# Patient Record
Sex: Female | Born: 1950 | Race: White | Hispanic: No | Marital: Single | Smoking: Never smoker
Health system: Southern US, Community
[De-identification: ages and names within clinical notes are randomized; demographics above are authoritative.]

## PROBLEM LIST (undated history)

## (undated) DIAGNOSIS — M21379 Foot drop, unspecified foot: Secondary | ICD-10-CM

## (undated) DIAGNOSIS — J302 Other seasonal allergic rhinitis: Secondary | ICD-10-CM

## (undated) DIAGNOSIS — K219 Gastro-esophageal reflux disease without esophagitis: Secondary | ICD-10-CM

## (undated) DIAGNOSIS — E7800 Pure hypercholesterolemia, unspecified: Secondary | ICD-10-CM

## (undated) DIAGNOSIS — Z87898 Personal history of other specified conditions: Secondary | ICD-10-CM

## (undated) DIAGNOSIS — D241 Benign neoplasm of right breast: Secondary | ICD-10-CM

## (undated) DIAGNOSIS — Z972 Presence of dental prosthetic device (complete) (partial): Secondary | ICD-10-CM

## (undated) DIAGNOSIS — R2689 Other abnormalities of gait and mobility: Secondary | ICD-10-CM

## (undated) DIAGNOSIS — N183 Chronic kidney disease, stage 3 unspecified: Secondary | ICD-10-CM

## (undated) DIAGNOSIS — H269 Unspecified cataract: Secondary | ICD-10-CM

## (undated) DIAGNOSIS — I10 Essential (primary) hypertension: Secondary | ICD-10-CM

## (undated) HISTORY — PX: ABDOMINAL HYSTERECTOMY: SHX81

## (undated) HISTORY — DX: Pure hypercholesterolemia, unspecified: E78.00

## (undated) HISTORY — DX: Chronic kidney disease, stage 3 unspecified: N18.30

## (undated) HISTORY — DX: Foot drop, unspecified foot: M21.379

## (undated) HISTORY — DX: Other abnormalities of gait and mobility: R26.89

---
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
## 1999-07-30 ENCOUNTER — Encounter: Payer: Self-pay | Admitting: Family Medicine

## 1999-07-30 ENCOUNTER — Encounter: Admission: RE | Admit: 1999-07-30 | Discharge: 1999-07-30 | Payer: Self-pay | Admitting: Family Medicine

## 2000-09-12 ENCOUNTER — Encounter: Payer: Self-pay | Admitting: Family Medicine

## 2000-09-12 ENCOUNTER — Encounter: Admission: RE | Admit: 2000-09-12 | Discharge: 2000-09-12 | Payer: Self-pay | Admitting: Family Medicine

## 2002-01-04 ENCOUNTER — Encounter: Admission: RE | Admit: 2002-01-04 | Discharge: 2002-01-04 | Payer: Self-pay | Admitting: Family Medicine

## 2002-01-04 ENCOUNTER — Encounter: Payer: Self-pay | Admitting: Family Medicine

## 2016-08-03 ENCOUNTER — Other Ambulatory Visit: Payer: Self-pay | Admitting: Physician Assistant

## 2017-08-05 ENCOUNTER — Emergency Department (HOSPITAL_COMMUNITY)
Admission: EM | Admit: 2017-08-05 | Discharge: 2017-08-06 | Disposition: A | Discharge status: Home / Self Care | Payer: BC Managed Care – PPO | Attending: Emergency Medicine | Admitting: Emergency Medicine

## 2017-08-05 ENCOUNTER — Encounter (HOSPITAL_COMMUNITY): Payer: Self-pay | Admitting: Emergency Medicine

## 2017-08-05 DIAGNOSIS — R42 Dizziness and giddiness: Secondary | ICD-10-CM | POA: Insufficient documentation

## 2017-08-05 DIAGNOSIS — R531 Weakness: Secondary | ICD-10-CM | POA: Diagnosis present

## 2017-08-05 DIAGNOSIS — R05 Cough: Secondary | ICD-10-CM | POA: Insufficient documentation

## 2017-08-05 DIAGNOSIS — R059 Cough, unspecified: Secondary | ICD-10-CM

## 2017-08-05 LAB — I-STAT CG4 LACTIC ACID, ED: LACTIC ACID, VENOUS: 2.29 mmol/L — AB (ref 0.5–1.9)

## 2017-08-05 MED ORDER — SODIUM CHLORIDE 0.9 % IV SOLN
INTRAVENOUS | Status: DC
  Administered 2017-08-05: via INTRAVENOUS

## 2017-08-05 MED ORDER — MECLIZINE HCL 25 MG PO TABS
25.0000 mg | ORAL_TABLET | Freq: Once | ORAL | Status: AC
  Administered 2017-08-05: 25 mg via ORAL
  Filled 2017-08-05: qty 1

## 2017-08-05 NOTE — ED Provider Notes (Signed)
[facility] DEPT Provider Note   CSN: [id number] Arrival date & time: 08/05/17  2300     History   Chief Complaint Chief Complaint  Patient presents with  . Weakness  . Nausea    HPI Alexandria Mcclure is a 67 y.o. female.  67 year old female who presents with acute onset of weakness with some associated chills as well as dizziness.  Denies any headache or vomiting but did have some nausea.  States that the weakness was diffuse and became worse when she had associated dizziness.  Denies focality to her weakness.  No visual changes.  Symptoms better with remaining still.  Denies any associated chest pain or shortness of breath.  No diaphoresis.  No recent history of ear pain or URI symptoms.  Does have a history of vertigo in the past.  No treatment used prior to arrival      No past medical history on file.  There are no active problems to display for this patient.     OB History    No data available       Home Medications    Prior to Admission medications   Not on File    Family History No family history on file.  Social History Social History   Tobacco Use  . Smoking status: Not on file  Substance Use Topics  . Alcohol use: Not on file  . Drug use: Not on file     Allergies   Patient has no allergy information on record.   Review of Systems Review of Systems  All other systems reviewed and are negative.    Physical Exam Updated Vital Signs There were no vitals taken for this visit.  Physical Exam  Constitutional: She is oriented to person, place, and time. She appears well-developed and well-nourished.  Non-toxic appearance. No distress.  HENT:  Head: Normocephalic and atraumatic.  Eyes: Conjunctivae, EOM and lids are normal. Pupils are equal, round, and reactive to light.  Neck: Normal range of motion. Neck supple. No tracheal deviation present. No thyroid mass present.  Cardiovascular: Normal rate, regular  rhythm and normal heart sounds. Exam reveals no gallop.  No murmur heard. Pulmonary/Chest: Effort normal and breath sounds normal. No stridor. No respiratory distress. She has no decreased breath sounds. She has no wheezes. She has no rhonchi. She has no rales.  Abdominal: Soft. Normal appearance and bowel sounds are normal. She exhibits no distension. There is no tenderness. There is no rebound and no CVA tenderness.  Musculoskeletal: Normal range of motion. She exhibits no edema or tenderness.  Neurological: She is alert and oriented to person, place, and time. She has normal strength. She displays no tremor. No cranial nerve deficit or sensory deficit. GCS eye subscore is 4. GCS verbal subscore is 5. GCS motor subscore is 6.  Skin: Skin is warm and dry. No abrasion and no rash noted.  Psychiatric: She has a normal mood and affect. Her speech is normal and behavior is normal.  Nursing note and vitals reviewed.    ED Treatments / Results  Labs (all labs ordered are listed, but only abnormal results are displayed) Labs Reviewed  CBC WITH DIFFERENTIAL/PLATELET  COMPREHENSIVE METABOLIC PANEL  TROPONIN I  I-STAT CG4 LACTIC ACID, ED    EKG  EKG Interpretation None       Radiology No results found.  Procedures Procedures (including critical care time)  Medications Ordered in ED Medications  0.9 %  sodium chloride infusion (not  administered)  meclizine (ANTIVERT) tablet 25 mg (not administered)     Initial Impression / Assessment and Plan / ED Course  I have reviewed the triage vital signs and the nursing notes.  Pertinent labs & imaging results that were available during my care of the patient were reviewed by me and considered in my medical decision making (see chart for details).     Patient given Antivert with some relief of her symptoms.  Attempted to ambulate the patient and she was ataxic.  Concern for possible intracranial hemorrhage and head CT was negative.  Was  medicated with Valium and MRI is pending at this time.  Signed out to Dr. Gilford Raid  Final Clinical Impressions(s) / ED Diagnoses   Final diagnoses:  None    ED Discharge Orders    None       Lacretia Leigh, MD 08/06/17 [phone]

## 2017-08-05 NOTE — ED Triage Notes (Signed)
Pt arrives via ems, sudden onset nausea and weakness, and felt hot and cold. Pt comes from home. No pain, no sob, no recent injury or infection. V/s on arrival 128/78, pulse 76, spo2 98, rr18.  cbg 193. Pt has 20 in left hand, 4 mg Zofran given by ems.   No allergies, hx of high blood pressure.

## 2017-08-05 NOTE — ED Notes (Signed)
Bed: PP29 Expected date:  Expected time:  Means of arrival:  Comments: 67 yo f/weakness

## 2017-08-05 NOTE — ED Notes (Signed)
Notified EDP,Allen,MD. Pt. I-stat CG4 Lactic acid results and RN,Jon made aware.

## 2017-08-06 ENCOUNTER — Emergency Department (HOSPITAL_COMMUNITY): Payer: BC Managed Care – PPO

## 2017-08-06 LAB — URINALYSIS, ROUTINE W REFLEX MICROSCOPIC
BACTERIA UA: NONE SEEN
Bilirubin Urine: NEGATIVE
GLUCOSE, UA: NEGATIVE mg/dL
KETONES UR: NEGATIVE mg/dL
Leukocytes, UA: NEGATIVE
NITRITE: NEGATIVE
PROTEIN: NEGATIVE mg/dL
Specific Gravity, Urine: 1.016 (ref 1.005–1.030)
pH: 5 (ref 5.0–8.0)

## 2017-08-06 LAB — COMPREHENSIVE METABOLIC PANEL
ALT: 16 U/L (ref 14–54)
AST: 18 U/L (ref 15–41)
Albumin: 3.8 g/dL (ref 3.5–5.0)
Alkaline Phosphatase: 66 U/L (ref 38–126)
Anion gap: 8 (ref 5–15)
BILIRUBIN TOTAL: 0.3 mg/dL (ref 0.3–1.2)
BUN: 23 mg/dL — AB (ref 6–20)
CO2: 28 mmol/L (ref 22–32)
Calcium: 9.8 mg/dL (ref 8.9–10.3)
Chloride: 104 mmol/L (ref 101–111)
Creatinine, Ser: 1.06 mg/dL — ABNORMAL HIGH (ref 0.44–1.00)
GFR calc Af Amer: >60 mL/min (ref >60)
GFR, EST NON AFRICAN AMERICAN: 53 mL/min — AB (ref >60)
GLUCOSE: 182 mg/dL — AB (ref 65–99)
POTASSIUM: 3.9 mmol/L (ref 3.5–5.1)
Sodium: 140 mmol/L (ref 135–145)
TOTAL PROTEIN: 7.1 g/dL (ref 6.5–8.1)

## 2017-08-06 LAB — CBC WITH DIFFERENTIAL/PLATELET
BASOS ABS: 0 10*3/uL (ref 0.0–0.1)
Basophils Relative: 0 %
Eosinophils Absolute: 0 10*3/uL (ref 0.0–0.7)
Eosinophils Relative: 0 %
HEMATOCRIT: 39 % (ref 36.0–46.0)
HEMOGLOBIN: 12.6 g/dL (ref 12.0–15.0)
LYMPHS PCT: 14 %
Lymphs Abs: 1.3 10*3/uL (ref 0.7–4.0)
MCH: 29.2 pg (ref 26.0–34.0)
MCHC: 32.3 g/dL (ref 30.0–36.0)
MCV: 90.3 fL (ref 78.0–100.0)
MONO ABS: 0.2 10*3/uL (ref 0.1–1.0)
Monocytes Relative: 2 %
NEUTROS ABS: 7.9 10*3/uL — AB (ref 1.7–7.7)
NEUTROS PCT: 84 %
Platelets: 301 10*3/uL (ref 150–400)
RBC: 4.32 MIL/uL (ref 3.87–5.11)
RDW: 13.2 % (ref 11.5–15.5)
WBC: 9.4 10*3/uL (ref 4.0–10.5)

## 2017-08-06 LAB — TROPONIN I: Troponin I: <0.03 ng/mL (ref <0.03)

## 2017-08-06 MED ORDER — DIAZEPAM 5 MG PO TABS: 5.0000 mg | ORAL_TABLET | Freq: Two times a day (BID) | ORAL | 0 refills | Status: DC

## 2017-08-06 MED ORDER — DIAZEPAM 5 MG PO TABS
5.0000 mg | ORAL_TABLET | Freq: Once | ORAL | Status: AC
  Administered 2017-08-06: 5 mg via ORAL
  Filled 2017-08-06: qty 1

## 2017-08-06 MED ORDER — MECLIZINE HCL 25 MG PO TABS: 25.0000 mg | ORAL_TABLET | Freq: Three times a day (TID) | ORAL | 0 refills | Status: DC | PRN

## 2017-08-06 MED ORDER — ONDANSETRON 4 MG PO TBDP: 4.0000 mg | ORAL_TABLET | Freq: Three times a day (TID) | ORAL | 0 refills | Status: DC | PRN

## 2017-08-06 NOTE — ED Notes (Signed)
Dr.Allen at bedside with pt.

## 2017-08-06 NOTE — ED Notes (Signed)
While pt was in the bathroom to void, w/assistance, pt c/o dizziness and needed a wheelchair to get back to her room.

## 2017-08-06 NOTE — ED Provider Notes (Signed)
Pt signed out by Dr. Zenia Resides pending MRI.  MRI ok.  Pt still feels a little dizzy, but feels ready to go home.  Pt's family will be with her today.  Pt knows to return if worse.   Isla Pence, MD 08/06/17 1021

## 2017-08-07 LAB — URINE CULTURE: Culture: <10000 — AB

## 2017-09-05 ENCOUNTER — Other Ambulatory Visit: Payer: Self-pay | Admitting: Radiology

## 2017-09-21 ENCOUNTER — Ambulatory Visit: Payer: Self-pay | Admitting: General Surgery

## 2017-09-21 DIAGNOSIS — D241 Benign neoplasm of right breast: Secondary | ICD-10-CM

## 2017-09-27 DIAGNOSIS — D241 Benign neoplasm of right breast: Secondary | ICD-10-CM

## 2017-09-27 HISTORY — DX: Benign neoplasm of right breast: D24.1

## 2017-10-13 ENCOUNTER — Other Ambulatory Visit: Payer: Self-pay

## 2017-10-13 ENCOUNTER — Encounter (HOSPITAL_BASED_OUTPATIENT_CLINIC_OR_DEPARTMENT_OTHER): Payer: Self-pay | Admitting: *Deleted

## 2017-10-13 NOTE — Pre-Procedure Instructions (Signed)
To come for BMET and to pick up Ensure pre-surgery drink 10 oz. - to drink by [id number] DOS.

## 2017-10-17 ENCOUNTER — Encounter (HOSPITAL_BASED_OUTPATIENT_CLINIC_OR_DEPARTMENT_OTHER)
Admission: RE | Admit: 2017-10-17 | Discharge: 2017-10-17 | Disposition: A | Discharge status: Home / Self Care | Payer: BC Managed Care – PPO | Source: Ambulatory Visit | Attending: General Surgery | Admitting: General Surgery

## 2017-10-17 DIAGNOSIS — Z01812 Encounter for preprocedural laboratory examination: Secondary | ICD-10-CM | POA: Diagnosis not present

## 2017-10-17 DIAGNOSIS — E669 Obesity, unspecified: Secondary | ICD-10-CM | POA: Diagnosis not present

## 2017-10-17 DIAGNOSIS — Z7982 Long term (current) use of aspirin: Secondary | ICD-10-CM | POA: Diagnosis not present

## 2017-10-17 DIAGNOSIS — I10 Essential (primary) hypertension: Secondary | ICD-10-CM | POA: Diagnosis not present

## 2017-10-17 DIAGNOSIS — K219 Gastro-esophageal reflux disease without esophagitis: Secondary | ICD-10-CM | POA: Diagnosis not present

## 2017-10-17 DIAGNOSIS — Z79899 Other long term (current) drug therapy: Secondary | ICD-10-CM | POA: Diagnosis not present

## 2017-10-17 DIAGNOSIS — Z6831 Body mass index (BMI) 31.0-31.9, adult: Secondary | ICD-10-CM | POA: Diagnosis not present

## 2017-10-17 DIAGNOSIS — D241 Benign neoplasm of right breast: Secondary | ICD-10-CM | POA: Diagnosis present

## 2017-10-17 LAB — BASIC METABOLIC PANEL
Anion gap: 12 (ref 5–15)
BUN: 22 mg/dL — AB (ref 6–20)
CHLORIDE: 103 mmol/L (ref 101–111)
CO2: 25 mmol/L (ref 22–32)
CREATININE: 1.21 mg/dL — AB (ref 0.44–1.00)
Calcium: 9.9 mg/dL (ref 8.9–10.3)
GFR calc non Af Amer: 45 mL/min — ABNORMAL LOW (ref >60)
GFR, EST AFRICAN AMERICAN: 52 mL/min — AB (ref >60)
GLUCOSE: 90 mg/dL (ref 65–99)
POTASSIUM: 3.8 mmol/L (ref 3.5–5.1)
Sodium: 140 mmol/L (ref 135–145)

## 2017-10-17 NOTE — Progress Notes (Signed)
Ensure pre surgery drink given with instructions to complete by 0615 dos, pt verbalized understanding. 

## 2017-10-19 ENCOUNTER — Ambulatory Visit (HOSPITAL_BASED_OUTPATIENT_CLINIC_OR_DEPARTMENT_OTHER): Payer: BC Managed Care – PPO | Admitting: Anesthesiology

## 2017-10-19 ENCOUNTER — Other Ambulatory Visit: Payer: Self-pay

## 2017-10-19 ENCOUNTER — Ambulatory Visit (HOSPITAL_BASED_OUTPATIENT_CLINIC_OR_DEPARTMENT_OTHER)
Admission: RE | Admit: 2017-10-19 | Discharge: 2017-10-19 | Disposition: A | Discharge status: Home / Self Care | Payer: BC Managed Care – PPO | Source: Ambulatory Visit | Attending: General Surgery | Admitting: General Surgery

## 2017-10-19 ENCOUNTER — Encounter (HOSPITAL_BASED_OUTPATIENT_CLINIC_OR_DEPARTMENT_OTHER)
Admission: RE | Disposition: A | Discharge status: Home / Self Care | Payer: Self-pay | Source: Ambulatory Visit | Attending: General Surgery

## 2017-10-19 ENCOUNTER — Encounter (HOSPITAL_BASED_OUTPATIENT_CLINIC_OR_DEPARTMENT_OTHER): Payer: Self-pay | Admitting: Anesthesiology

## 2017-10-19 DIAGNOSIS — Z7982 Long term (current) use of aspirin: Secondary | ICD-10-CM | POA: Insufficient documentation

## 2017-10-19 DIAGNOSIS — K219 Gastro-esophageal reflux disease without esophagitis: Secondary | ICD-10-CM | POA: Insufficient documentation

## 2017-10-19 DIAGNOSIS — D241 Benign neoplasm of right breast: Secondary | ICD-10-CM | POA: Diagnosis not present

## 2017-10-19 DIAGNOSIS — I10 Essential (primary) hypertension: Secondary | ICD-10-CM | POA: Insufficient documentation

## 2017-10-19 DIAGNOSIS — Z6831 Body mass index (BMI) 31.0-31.9, adult: Secondary | ICD-10-CM | POA: Insufficient documentation

## 2017-10-19 DIAGNOSIS — E669 Obesity, unspecified: Secondary | ICD-10-CM | POA: Insufficient documentation

## 2017-10-19 DIAGNOSIS — Z79899 Other long term (current) drug therapy: Secondary | ICD-10-CM | POA: Insufficient documentation

## 2017-10-19 HISTORY — DX: Personal history of other specified conditions: Z87.898

## 2017-10-19 HISTORY — DX: Unspecified cataract: H26.9

## 2017-10-19 HISTORY — DX: Benign neoplasm of right breast: D24.1

## 2017-10-19 HISTORY — DX: Presence of dental prosthetic device (complete) (partial): Z97.2

## 2017-10-19 HISTORY — DX: Essential (primary) hypertension: I10

## 2017-10-19 HISTORY — DX: Gastro-esophageal reflux disease without esophagitis: K21.9

## 2017-10-19 HISTORY — DX: Other seasonal allergic rhinitis: J30.2

## 2017-10-19 HISTORY — PX: BREAST LUMPECTOMY WITH RADIOACTIVE SEED LOCALIZATION: SHX6424

## 2017-10-19 SURGERY — BREAST LUMPECTOMY WITH RADIOACTIVE SEED LOCALIZATION
Anesthesia: General | Site: Breast | Laterality: Right

## 2017-10-19 MED ORDER — PROPOFOL 500 MG/50ML IV EMUL
INTRAVENOUS | Status: AC
  Filled 2017-10-19: qty 50

## 2017-10-19 MED ORDER — EPHEDRINE SULFATE 50 MG/ML IJ SOLN
INTRAMUSCULAR | Status: DC | PRN
  Administered 2017-10-19 (×2): 10 mg via INTRAVENOUS

## 2017-10-19 MED ORDER — FENTANYL CITRATE (PF) 100 MCG/2ML IJ SOLN
50.0000 ug | INTRAMUSCULAR | Status: AC | PRN
  Administered 2017-10-19 (×2): 25 ug via INTRAVENOUS
  Administered 2017-10-19: 50 ug via INTRAVENOUS

## 2017-10-19 MED ORDER — ONDANSETRON HCL 4 MG/2ML IJ SOLN
INTRAMUSCULAR | Status: AC
  Filled 2017-10-19: qty 2

## 2017-10-19 MED ORDER — ACETAMINOPHEN 500 MG PO TABS
ORAL_TABLET | ORAL | Status: AC
  Filled 2017-10-19: qty 2

## 2017-10-19 MED ORDER — PROPOFOL 10 MG/ML IV BOLUS
INTRAVENOUS | Status: DC | PRN
  Administered 2017-10-19: 170 mg via INTRAVENOUS
  Administered 2017-10-19: 30 mg via INTRAVENOUS

## 2017-10-19 MED ORDER — DEXAMETHASONE SODIUM PHOSPHATE 4 MG/ML IJ SOLN
INTRAMUSCULAR | Status: DC | PRN
  Administered 2017-10-19: 10 mg via INTRAVENOUS

## 2017-10-19 MED ORDER — SCOPOLAMINE 1 MG/3DAYS TD PT72: 1.0000 | MEDICATED_PATCH | Freq: Once | TRANSDERMAL | Status: DC | PRN

## 2017-10-19 MED ORDER — LIDOCAINE HCL (CARDIAC) PF 100 MG/5ML IV SOSY
PREFILLED_SYRINGE | INTRAVENOUS | Status: DC | PRN
  Administered 2017-10-19: 50 mg via INTRAVENOUS

## 2017-10-19 MED ORDER — CELECOXIB 200 MG PO CAPS
200.0000 mg | ORAL_CAPSULE | ORAL | Status: AC
  Administered 2017-10-19: 200 mg via ORAL

## 2017-10-19 MED ORDER — CHLORHEXIDINE GLUCONATE CLOTH 2 % EX PADS: 6.0000 | MEDICATED_PAD | Freq: Once | CUTANEOUS | Status: DC

## 2017-10-19 MED ORDER — HYDROCODONE-ACETAMINOPHEN 7.5-325 MG PO TABS: 1.0000 | ORAL_TABLET | Freq: Once | ORAL | Status: DC | PRN

## 2017-10-19 MED ORDER — BUPIVACAINE-EPINEPHRINE (PF) 0.25% -1:200000 IJ SOLN
INTRAMUSCULAR | Status: AC
  Filled 2017-10-19: qty 30

## 2017-10-19 MED ORDER — BUPIVACAINE HCL (PF) 0.25 % IJ SOLN
INTRAMUSCULAR | Status: AC
  Filled 2017-10-19: qty 120

## 2017-10-19 MED ORDER — DEXAMETHASONE SODIUM PHOSPHATE 10 MG/ML IJ SOLN
INTRAMUSCULAR | Status: AC
  Filled 2017-10-19: qty 1

## 2017-10-19 MED ORDER — METOCLOPRAMIDE HCL 5 MG/ML IJ SOLN: 10.0000 mg | Freq: Once | INTRAMUSCULAR | Status: DC | PRN

## 2017-10-19 MED ORDER — CELECOXIB 200 MG PO CAPS
ORAL_CAPSULE | ORAL | Status: AC
  Filled 2017-10-19: qty 1

## 2017-10-19 MED ORDER — GABAPENTIN 300 MG PO CAPS
300.0000 mg | ORAL_CAPSULE | ORAL | Status: AC
  Administered 2017-10-19: 300 mg via ORAL

## 2017-10-19 MED ORDER — HEPARIN SOD (PORK) LOCK FLUSH 100 UNIT/ML IV SOLN
INTRAVENOUS | Status: AC
  Filled 2017-10-19: qty 5

## 2017-10-19 MED ORDER — FENTANYL CITRATE (PF) 100 MCG/2ML IJ SOLN
INTRAMUSCULAR | Status: AC
  Filled 2017-10-19: qty 2

## 2017-10-19 MED ORDER — LIDOCAINE HCL (CARDIAC) PF 100 MG/5ML IV SOSY
PREFILLED_SYRINGE | INTRAVENOUS | Status: AC
  Filled 2017-10-19: qty 5

## 2017-10-19 MED ORDER — METHYLENE BLUE 0.5 % INJ SOLN
INTRAVENOUS | Status: AC
  Filled 2017-10-19: qty 10

## 2017-10-19 MED ORDER — HYDROCODONE-ACETAMINOPHEN 5-325 MG PO TABS: 1.0000 | ORAL_TABLET | Freq: Four times a day (QID) | ORAL | 0 refills | Status: AC | PRN

## 2017-10-19 MED ORDER — SODIUM CHLORIDE 0.9 % IJ SOLN
INTRAMUSCULAR | Status: AC
  Filled 2017-10-19: qty 10

## 2017-10-19 MED ORDER — EPHEDRINE SULFATE 50 MG/ML IJ SOLN
INTRAMUSCULAR | Status: AC
  Filled 2017-10-19: qty 1

## 2017-10-19 MED ORDER — CEFAZOLIN SODIUM-DEXTROSE 2-4 GM/100ML-% IV SOLN
INTRAVENOUS | Status: AC
  Filled 2017-10-19: qty 100

## 2017-10-19 MED ORDER — CEFAZOLIN SODIUM-DEXTROSE 2-4 GM/100ML-% IV SOLN
2.0000 g | INTRAVENOUS | Status: AC
  Administered 2017-10-19: 2 g via INTRAVENOUS

## 2017-10-19 MED ORDER — MEPERIDINE HCL 25 MG/ML IJ SOLN: 6.2500 mg | INTRAMUSCULAR | Status: DC | PRN

## 2017-10-19 MED ORDER — GABAPENTIN 300 MG PO CAPS
ORAL_CAPSULE | ORAL | Status: AC
  Filled 2017-10-19: qty 1

## 2017-10-19 MED ORDER — MIDAZOLAM HCL 2 MG/2ML IJ SOLN
1.0000 mg | INTRAMUSCULAR | Status: DC | PRN
  Administered 2017-10-19: 2 mg via INTRAVENOUS

## 2017-10-19 MED ORDER — LACTATED RINGERS IV SOLN
INTRAVENOUS | Status: DC
  Administered 2017-10-19: 09:00:00 via INTRAVENOUS

## 2017-10-19 MED ORDER — BUPIVACAINE-EPINEPHRINE (PF) 0.25% -1:200000 IJ SOLN
INTRAMUSCULAR | Status: DC | PRN
  Administered 2017-10-19: 20 mL

## 2017-10-19 MED ORDER — FENTANYL CITRATE (PF) 100 MCG/2ML IJ SOLN: 25.0000 ug | INTRAMUSCULAR | Status: DC | PRN

## 2017-10-19 MED ORDER — HEPARIN (PORCINE) IN NACL 1000-0.9 UT/500ML-% IV SOLN
INTRAVENOUS | Status: AC
  Filled 2017-10-19: qty 500

## 2017-10-19 MED ORDER — ACETAMINOPHEN 500 MG PO TABS
1000.0000 mg | ORAL_TABLET | ORAL | Status: AC
  Administered 2017-10-19: 1000 mg via ORAL

## 2017-10-19 MED ORDER — MIDAZOLAM HCL 2 MG/2ML IJ SOLN
INTRAMUSCULAR | Status: AC
  Filled 2017-10-19: qty 2

## 2017-10-19 SURGICAL SUPPLY — 51 items
ADH SKN CLS APL DERMABOND .7 (GAUZE/BANDAGES/DRESSINGS) ×1
APPLIER CLIP 9.375 MED OPEN (MISCELLANEOUS)
APR CLP MED 9.3 20 MLT OPN (MISCELLANEOUS)
BLADE SURG 15 STRL LF DISP TIS (BLADE) ×1 IMPLANT
BLADE SURG 15 STRL SS (BLADE) ×3
CANISTER SUC SOCK COL 7IN (MISCELLANEOUS) ×1 IMPLANT
CANISTER SUCT 1200ML W/VALVE (MISCELLANEOUS) ×1 IMPLANT
CHLORAPREP W/TINT 26ML (MISCELLANEOUS) ×3 IMPLANT
CLIP APPLIE 9.375 MED OPEN (MISCELLANEOUS) IMPLANT
COVER BACK TABLE 60X90IN (DRAPES) ×3 IMPLANT
COVER MAYO STAND STRL (DRAPES) ×3 IMPLANT
COVER PROBE W GEL 5X96 (DRAPES) ×3 IMPLANT
DECANTER SPIKE VIAL GLASS SM (MISCELLANEOUS) IMPLANT
DERMABOND ADVANCED (GAUZE/BANDAGES/DRESSINGS) ×2
DERMABOND ADVANCED .7 DNX12 (GAUZE/BANDAGES/DRESSINGS) ×1 IMPLANT
DEVICE DUBIN W/COMP PLATE 8390 (MISCELLANEOUS) ×3 IMPLANT
DRAPE LAPAROSCOPIC ABDOMINAL (DRAPES) ×3 IMPLANT
DRAPE UTILITY XL STRL (DRAPES) ×3 IMPLANT
ELECT COATED BLADE 2.86 ST (ELECTRODE) ×3 IMPLANT
ELECT REM PT RETURN 9FT ADLT (ELECTROSURGICAL) ×3
ELECTRODE REM PT RTRN 9FT ADLT (ELECTROSURGICAL) ×1 IMPLANT
GLOVE BIO SURGEON STRL SZ 6.5 (GLOVE) ×1 IMPLANT
GLOVE BIO SURGEON STRL SZ7 (GLOVE) ×2 IMPLANT
GLOVE BIO SURGEON STRL SZ7.5 (GLOVE) ×6 IMPLANT
GLOVE BIO SURGEONS STRL SZ 6.5 (GLOVE) ×1
GLOVE BIOGEL PI IND STRL 6.5 (GLOVE) IMPLANT
GLOVE BIOGEL PI INDICATOR 6.5 (GLOVE) ×2
GLOVE EXAM NITRILE MD LF STRL (GLOVE) ×2 IMPLANT
GOWN STRL REUS W/ TWL LRG LVL3 (GOWN DISPOSABLE) ×2 IMPLANT
GOWN STRL REUS W/ TWL XL LVL3 (GOWN DISPOSABLE) IMPLANT
GOWN STRL REUS W/TWL LRG LVL3 (GOWN DISPOSABLE) ×6
GOWN STRL REUS W/TWL XL LVL3 (GOWN DISPOSABLE) ×3
ILLUMINATOR WAVEGUIDE N/F (MISCELLANEOUS) IMPLANT
KIT MARKER MARGIN INK (KITS) ×3 IMPLANT
LIGHT WAVEGUIDE WIDE FLAT (MISCELLANEOUS) IMPLANT
NDL HYPO 25X1 1.5 SAFETY (NEEDLE) IMPLANT
NEEDLE HYPO 25X1 1.5 SAFETY (NEEDLE) ×3 IMPLANT
NS IRRIG 1000ML POUR BTL (IV SOLUTION) ×2 IMPLANT
PACK BASIN DAY SURGERY FS (CUSTOM PROCEDURE TRAY) ×3 IMPLANT
PENCIL BUTTON HOLSTER BLD 10FT (ELECTRODE) ×3 IMPLANT
SLEEVE SCD COMPRESS KNEE MED (MISCELLANEOUS) ×3 IMPLANT
SPONGE LAP 18X18 RF (DISPOSABLE) ×3 IMPLANT
SUT MON AB 4-0 PC3 18 (SUTURE) ×2 IMPLANT
SUT SILK 2 0 SH (SUTURE) IMPLANT
SUT VICRYL 3-0 CR8 SH (SUTURE) ×3 IMPLANT
SYR CONTROL 10ML LL (SYRINGE) ×2 IMPLANT
TOWEL OR 17X24 6PK STRL BLUE (TOWEL DISPOSABLE) ×3 IMPLANT
TOWEL OR NON WOVEN STRL DISP B (DISPOSABLE) ×1 IMPLANT
TUBE CONNECTING 20'X1/4 (TUBING)
TUBE CONNECTING 20X1/4 (TUBING) ×1 IMPLANT
YANKAUER SUCT BULB TIP NO VENT (SUCTIONS) IMPLANT

## 2017-10-19 NOTE — Discharge Instructions (Signed)
No Tylenol until 3:15pm, no ibuprofen until 5:15pm!   Post Anesthesia Home Care Instructions  Activity: Get plenty of rest for the remainder of the day. A responsible individual must stay with you for 24 hours following the procedure.  For the next 24 hours, DO NOT: -Drive a car -Paediatric nurse -Drink alcoholic beverages -Take any medication unless instructed by your physician -Make any legal decisions or sign important papers.  Meals: Start with liquid foods such as gelatin or soup. Progress to regular foods as tolerated. Avoid greasy, spicy, heavy foods. If nausea and/or vomiting occur, drink only clear liquids until the nausea and/or vomiting subsides. Call your physician if vomiting continues.  Special Instructions/Symptoms: Your throat may feel dry or sore from the anesthesia or the breathing tube placed in your throat during surgery. If this causes discomfort, gargle with warm salt water. The discomfort should disappear within 24 hours.  If you had a scopolamine patch placed behind your ear for the management of post- operative nausea and/or vomiting:  1. The medication in the patch is effective for 72 hours, after which it should be removed.  Wrap patch in a tissue and discard in the trash. Wash hands thoroughly with soap and water. 2. You may remove the patch earlier than 72 hours if you experience unpleasant side effects which may include dry mouth, dizziness or visual disturbances. 3. Avoid touching the patch. Wash your hands with soap and water after contact with the patch.

## 2017-10-19 NOTE — Anesthesia Preprocedure Evaluation (Addendum)
Anesthesia Evaluation  Patient identified by MRN, date of birth, ID band Patient awake    Reviewed: Allergy & Precautions, NPO status , Patient's Chart, lab work & pertinent test results, reviewed documented beta blocker date and time   Airway Mallampati: II  TM Distance: >3 FB Neck ROM: Full    Dental  (+) Upper Dentures, Partial Lower   Pulmonary neg pulmonary ROS,    breath sounds clear to auscultation       Cardiovascular hypertension, Pt. on medications Normal cardiovascular exam Rhythm:Regular Rate:Normal     Neuro/Psych Seizures -,  Remote hx/o seizure- pregnancy related 1970, on no Rx negative psych ROS   GI/Hepatic Neg liver ROS, GERD  Medicated and Controlled,  Endo/Other  Right Breast papilloma Obesity  Renal/GU Renal InsufficiencyRenal disease  negative genitourinary   Musculoskeletal negative musculoskeletal ROS (+)   Abdominal (+) + obese,   Peds  Hematology negative hematology ROS (+)   Anesthesia Other Findings   Reproductive/Obstetrics                            Anesthesia Physical Anesthesia Plan  ASA: II  Anesthesia Plan: General   Post-op Pain Management:    Induction: Intravenous  PONV Risk Score and Plan:   Airway Management Planned: LMA  Additional Equipment:   Intra-op Plan:   Post-operative Plan: Extubation in OR  Informed Consent: I have reviewed the patients History and Physical, chart, labs and discussed the procedure including the risks, benefits and alternatives for the proposed anesthesia with the patient or authorized representative who has indicated his/her understanding and acceptance.   Dental advisory given  Plan Discussed with: Anesthesiologist, CRNA and Surgeon  Anesthesia Plan Comments:         Anesthesia Quick Evaluation

## 2017-10-19 NOTE — Op Note (Signed)
10/19/2017  11:02 AM  PATIENT:  Alexandria Mcclure  66 y.o. female  PRE-OPERATIVE DIAGNOSIS:  right breast papilloma  POST-OPERATIVE DIAGNOSIS:  right breast papilloma  PROCEDURE:  Procedure(s): RIGHT BREAST LUMPECTOMY WITH RADIOACTIVE SEED LOCALIZATION ERAS PATHWAY (Right)  SURGEON:  Surgeon(s) and Role:    * Jovita Kussmaul, MD - Primary  PHYSICIAN ASSISTANT:   ASSISTANTS: none   ANESTHESIA:   local and general  EBL:  3 mL   BLOOD ADMINISTERED:none  DRAINS: none   LOCAL MEDICATIONS USED:  MARCAINE     SPECIMEN:  Source of Specimen:  right breast tissue  DISPOSITION OF SPECIMEN:  PATHOLOGY  COUNTS:  YES  TOURNIQUET:  * No tourniquets in log *  DICTATION: .Dragon Dictation   After informed consent was obtained the patient was brought to the operating room and placed in the supine position on the operating table.  After adequate induction of general anesthesia the patient's right breast was prepped with ChloraPrep, allowed to dry, and draped in usual sterile manner.  An appropriate timeout was performed.  Previously an I-125 seed was placed in the outer aspect of the right breast to mark an area of an intraductal papilloma.  The neoprobe was set to I-125 in the area of radioactivity was readily identified in the outer aspect of the right breast.  The area around this was infiltrated with quarter percent Marcaine.  A curvilinear incision was made along the outer aspect of the breast near the axilla with a 15 blade knife.  The incision was carried through the skin and subcutaneous tissue sharply with electrocautery.  The dissection was then carried towards the radioactive seed under the direction of the neoprobe.  Once I approached the radioactive seed more closely I then removed a circular portion of breast tissue around the radioactive seed while checking the area of radioactivity frequently.  Once the specimen was removed it was oriented with the appropriate paint colors.  A  specimen radiograph was obtained that showed the clip and seed to be near the center of the specimen.  Specimen was then sent to pathology for further evaluation.  Hemostasis was achieved using the Bovie electrocautery.  The wound was infiltrated with more quarter percent Marcaine and irrigated with saline.  The deep layer of the wound was then closed with layers of interrupted 3-0 Vicryl stitches.  The skin was then closed with a running 4-0 Monocryl subcuticular stitch.  Dermabond dressings were applied.  The patient tolerated the procedure well.  At the end of the case all needle sponge and instrument counts were correct.  The patient was then awakened and taken to recovery in stable condition.  PLAN OF CARE: Discharge to home after PACU  PATIENT DISPOSITION:  PACU - hemodynamically stable.   Delay start of Pharmacological VTE agent (>24hrs) due to surgical blood loss or risk of bleeding: not applicable

## 2017-10-19 NOTE — Transfer of Care (Signed)
Immediate Anesthesia Transfer of Care Note  Patient: Alexandria Mcclure  Procedure(s) Performed: RIGHT BREAST LUMPECTOMY WITH RADIOACTIVE SEED LOCALIZATION ERAS PATHWAY (Right Breast)  Patient Location: PACU  Anesthesia Type:General  Level of Consciousness: awake and sedated  Airway & Oxygen Therapy: Patient Spontanous Breathing and Patient connected to face mask oxygen  Post-op Assessment: Report given to RN and Post -op Vital signs reviewed and stable  Post vital signs: Reviewed and stable  Last Vitals:  Vitals Value Taken Time  BP 88/52 10/19/2017 11:07 AM  Temp    Pulse 80 10/19/2017 11:09 AM  Resp 16 10/19/2017 11:09 AM  SpO2 99 % 10/19/2017 11:09 AM  Vitals shown include unvalidated device data.  Last Pain:  Vitals:   10/19/17 0847  TempSrc: Oral  PainSc:          Complications: No apparent anesthesia complications

## 2017-10-19 NOTE — Anesthesia Procedure Notes (Signed)
Procedure Name: LMA Insertion Performed by: Terrance Mass, CRNA Pre-anesthesia Checklist: Patient identified, Emergency Drugs available, Suction available and Patient being monitored Patient Re-evaluated:Patient Re-evaluated prior to induction Oxygen Delivery Method: Circle system utilized Preoxygenation: Pre-oxygenation with 100% oxygen Induction Type: IV induction Ventilation: Mask ventilation without difficulty LMA: LMA inserted LMA Size: 4.0 Number of attempts: 4 Placement Confirmation: positive ETCO2 Tube secured with: Tape Dental Injury: Teeth and Oropharynx as per pre-operative assessment

## 2017-10-19 NOTE — H&P (Signed)
Alexandria Mcclure  Location: [facility] Surgery Patient #: [id number] DOB: 1951/03/25 Undefined / Language: Alexandria Mcclure / Race: White Female   History of Present Illness  The patient is a 67 year old female who presents with a breast mass. We are asked to see the patient in consultation by Dr. Emmit Pomfret to evaluate her for a right breast papilloma. The patient is a 67 year old black female who recently went for a routine screening mammogram. At that time she was found to have a 1 cm area of calcification in the upper outer border of the right breast. This was biopsied and came back as an intraductal papilloma. She has no personal or family history of breast cancer. She has noted nipple discharge or pain in the breast. She does not smoke.   Past Surgical History  Breast Biopsy  Right. Hysterectomy (not due to cancer) - Partial   Diagnostic Studies History  Colonoscopy  1-5 years ago Mammogram  within last year Pap Smear  >5 years ago  Allergies No Known Drug Allergies Allergies Reconciled   Medication History Amlodipine Besy-Benazepril HCl (10-40MG  Capsule, Oral) Active. Triamterene-HCTZ (37.5-25MG  Tablet, Oral) Active. Aspirin (81MG  Tablet, Oral) Active. Omega-3 (500MG  Capsule, Oral) Active. Biotin (Oral) Specific strength unknown - Active. PriLOSEC OTC (20MG  Tablet DR, Oral) Active. Medications Reconciled  Social History  Alcohol use  Occasional alcohol use. Caffeine use  Carbonated beverages, Coffee, Tea. Tobacco use  Never smoker.  Family History  Diabetes Mellitus  Brother. Heart Disease  Brother. Heart disease in female family member before age 58  Heart disease in female family member before age 8  Hypertension  Brother, Father.  Pregnancy / Birth History Age at menarche  58 years. Gravida  2 Maternal age  27-20 Para  3  Other Problems Gastroesophageal Reflux Disease  High blood pressure     Review of Systems General  Present- Night Sweats. Not Present- Appetite Loss, Chills, Fatigue, Fever, Weight Gain and Weight Loss. Skin Present- Dryness. Not Present- Change in Wart/Mole, Hives, Jaundice, New Lesions, Non-Healing Wounds, Rash and Ulcer. HEENT Present- Wears glasses/contact lenses. Not Present- Earache, Hearing Loss, Hoarseness, Nose Bleed, Oral Ulcers, Ringing in the Ears, Seasonal Allergies, Sinus Pain, Sore Throat, Visual Disturbances and Yellow Eyes. Respiratory Not Present- Bloody sputum, Chronic Cough, Difficulty Breathing, Snoring and Wheezing. Breast Not Present- Breast Mass, Breast Pain, Nipple Discharge and Skin Changes. Cardiovascular Present- Swelling of Extremities. Not Present- Chest Pain, Difficulty Breathing Lying Down, Leg Cramps, Palpitations, Rapid Heart Rate and Shortness of Breath. Gastrointestinal Not Present- Abdominal Pain, Bloating, Bloody Stool, Change in Bowel Habits, Chronic diarrhea, Constipation, Difficulty Swallowing, Excessive gas, Gets full quickly at meals, Hemorrhoids, Indigestion, Nausea, Rectal Pain and Vomiting. Female Genitourinary Present- Frequency. Not Present- Nocturia, Painful Urination, Pelvic Pain and Urgency. Musculoskeletal Present- Joint Pain. Not Present- Back Pain, Joint Stiffness, Muscle Pain, Muscle Weakness and Swelling of Extremities. Neurological Present- Tingling. Not Present- Decreased Memory, Fainting, Headaches, Numbness, Seizures, Tremor, Trouble walking and Weakness. Psychiatric Not Present- Anxiety, Bipolar, Change in Sleep Pattern, Depression, Fearful and Frequent crying. Endocrine Present- Hot flashes. Not Present- Cold Intolerance, Excessive Hunger, Hair Changes, Heat Intolerance and New Diabetes. Hematology Not Present- Blood Thinners, Easy Bruising, Excessive bleeding, Gland problems, HIV and Persistent Infections.  Vitals  Weight: 183.6 lb Height: 66in Body Surface Area: 1.93 m Body Mass Index: 29.63 kg/m  Temp.: 97.89F  Pulse: 96  (Regular)  BP: 132/86 (Sitting, Left Arm, Standard)       Physical Exam  General Mental Status-Alert.  General Appearance-Consistent with stated age. Hydration-Well hydrated. Voice-Normal.  Head and Neck Head-normocephalic, atraumatic with no lesions or palpable masses. Trachea-midline. Thyroid Gland Characteristics - normal size and consistency.  Eye Eyeball - Bilateral-Extraocular movements intact. Sclera/Conjunctiva - Bilateral-No scleral icterus.  Chest and Lung Exam Chest and lung exam reveals -quiet, even and easy respiratory effort with no use of accessory muscles and on auscultation, normal breath sounds, no adventitious sounds and normal vocal resonance. Inspection Chest Wall - Normal. Back - normal.  Breast Note: There is no palpable mass in either breast. There is no palpable axillary, supraclavicular, or cervical lymphadenopathy.   Cardiovascular Cardiovascular examination reveals -normal heart sounds, regular rate and rhythm with no murmurs and normal pedal pulses bilaterally.  Abdomen Inspection Inspection of the abdomen reveals - No Hernias. Skin - Scar - no surgical scars. Palpation/Percussion Palpation and Percussion of the abdomen reveal - Soft, Non Tender, No Rebound tenderness, No Rigidity (guarding) and No hepatosplenomegaly. Auscultation Auscultation of the abdomen reveals - Bowel sounds normal.  Neurologic Neurologic evaluation reveals -alert and oriented x 3 with no impairment of recent or remote memory. Mental Status-Normal.  Musculoskeletal Normal Exam - Left-Upper Extremity Strength Normal and Lower Extremity Strength Normal. Normal Exam - Right-Upper Extremity Strength Normal and Lower Extremity Strength Normal.  Lymphatic Head & Neck  General Head & Neck Lymphatics: Bilateral - Description - Normal. Axillary  General Axillary Region: Bilateral - Description - Normal. Tenderness - Non Tender. Femoral  & Inguinal  Generalized Femoral & Inguinal Lymphatics: Bilateral - Description - Normal. Tenderness - Non Tender.    Assessment & Plan  INTRADUCTAL PAPILLOMA OF BREAST, RIGHT (D24.1) Impression: The patient appears to have an intraductal papilloma in the upper outer quadrant of the right breast. Because of its appearance and because there is about a 5% chance of upgrading to a more significant lesion she would like to have this removed. I think this is a reasonable way of dealing with this area. I have discussed with her in detail the risks and benefits of the operation as well as some of the technical aspects and she understands and wishes to proceed. I will plan for a right breast radioactive seed localized lumpectomy Current Plans Pt Education - Breast Diseases: discussed with patient and provided information.

## 2017-10-19 NOTE — Interval H&P Note (Signed)
History and Physical Interval Note:  10/19/2017 9:45 AM  Alexandria Mcclure  has presented today for surgery, with the diagnosis of right breast papilloma  The various methods of treatment have been discussed with the patient and family. After consideration of risks, benefits and other options for treatment, the patient has consented to  Procedure(s): RIGHT BREAST LUMPECTOMY WITH RADIOACTIVE SEED LOCALIZATION ERAS PATHWAY (Right) as a surgical intervention .  The patient's history has been reviewed, patient examined, no change in status, stable for surgery.  I have reviewed the patient's chart and labs.  Questions were answered to the patient's satisfaction.     TOTH III,Pradeep Beaubrun S

## 2017-10-19 NOTE — Anesthesia Postprocedure Evaluation (Signed)
Anesthesia Post Note  Patient: Alexandria Mcclure  Procedure(s) Performed: RIGHT BREAST LUMPECTOMY WITH RADIOACTIVE SEED LOCALIZATION ERAS PATHWAY (Right Breast)     Patient location during evaluation: PACU Anesthesia Type: General Level of consciousness: awake and alert and oriented Pain management: pain level controlled Vital Signs Assessment: post-procedure vital signs reviewed and stable Respiratory status: spontaneous breathing, nonlabored ventilation and respiratory function stable Cardiovascular status: blood pressure returned to baseline and stable Postop Assessment: no apparent nausea or vomiting Anesthetic complications: no    Last Vitals:  Vitals:   10/19/17 1230 10/19/17 1319  BP: 107/69 124/78  Pulse: 93 87  Resp: (!) 8 16  Temp:  (!) 36.2 C  SpO2: 100% 100%    Last Pain:  Vitals:   10/19/17 1319  TempSrc: Axillary  PainSc: 0-No pain                 Jacquan Savas A.

## 2017-10-20 ENCOUNTER — Encounter (HOSPITAL_BASED_OUTPATIENT_CLINIC_OR_DEPARTMENT_OTHER): Payer: Self-pay | Admitting: General Surgery

## 2019-09-19 IMAGING — MR MR HEAD W/O CM
10 series · 42 of 48 positions shown · non-contrast
Comparison: Head CT 08/06/2017

CLINICAL DATA: Dizziness.  Ataxia.

EXAM:
MRI HEAD WITHOUT CONTRAST
TECHNIQUE: Multiplanar, multiecho pulse sequences of the brain and surrounding
structures were obtained without intravenous contrast.

[Series 3: T1 · sagittal · 5.0mm · 0.47mm/px · 4 of 24 slices shown]
[im 1/24]
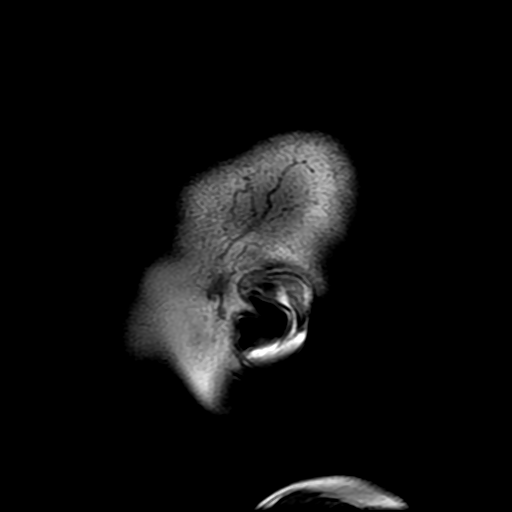
[im 8/24]
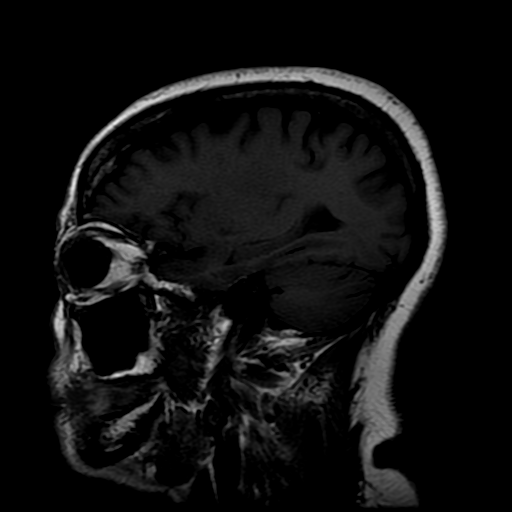
[im 16/24]
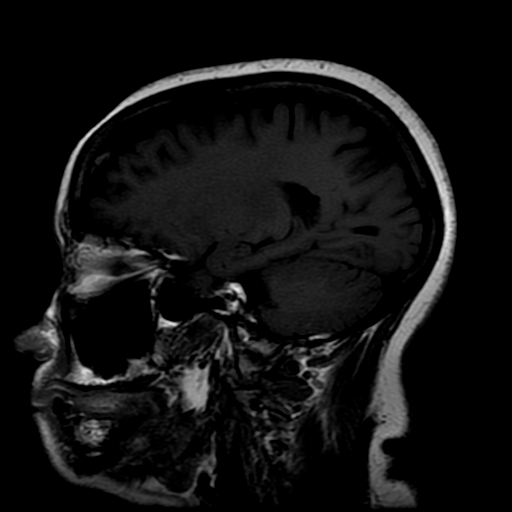
[im 24/24]
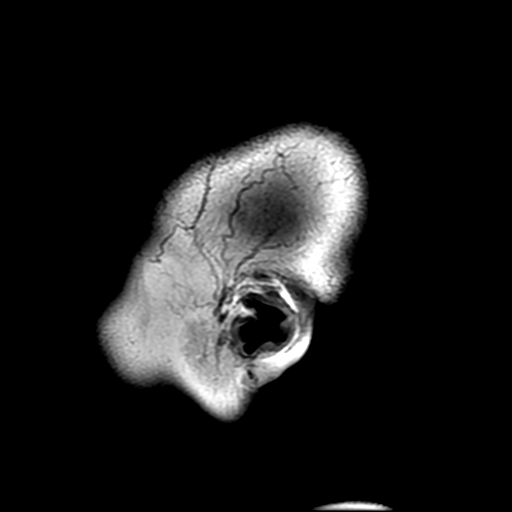

[Series 4: DWI · axial · 3.0mm · 1.09mm/px · z∈[-30,+102]mm · 9 of 90 slices shown (1 of 4)]
[im 1/90]
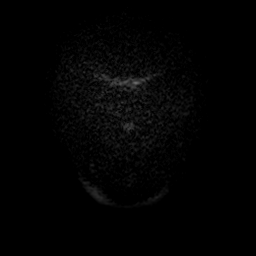
[im 17/90]
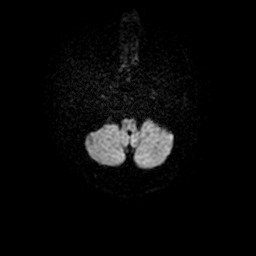
[im 25/90]
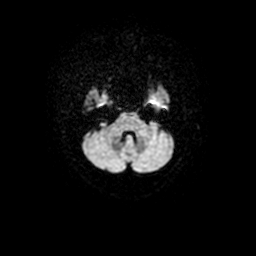
[im 41/90]
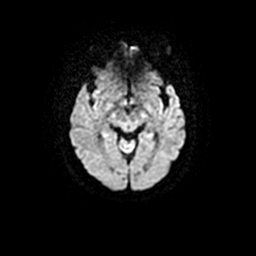
[im 49/90]
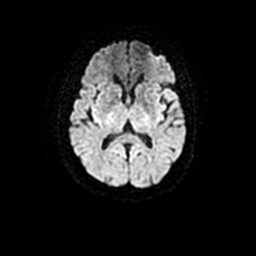
[im 65/90]
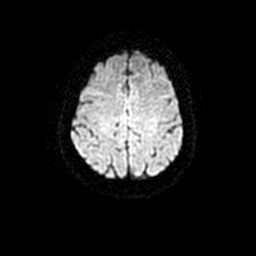
[im 73/90]
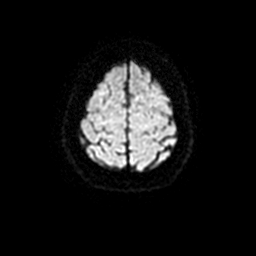
[im 81/90]
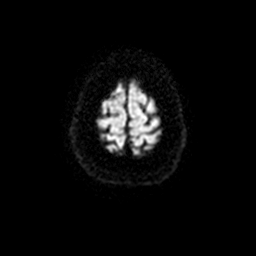
[im 90/90]
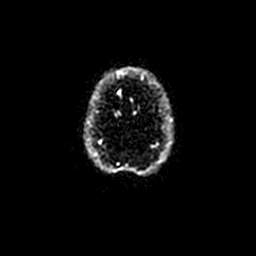

[Series 5: DWI · coronal · 5.0mm · 1.09mm/px · 9 of 70 slices shown (2 of 4)]
[im 1/70]
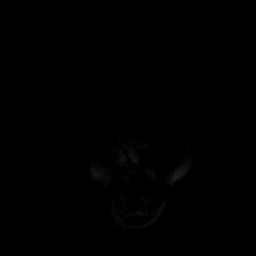
[im 9/70]
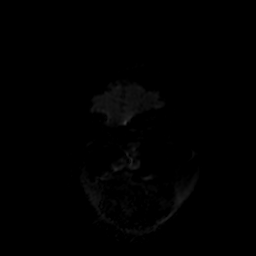
[im 18/70]
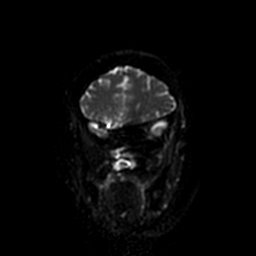
[im 26/70]
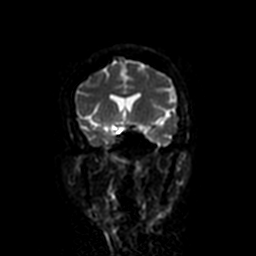
[im 35/70]
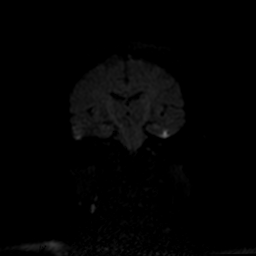
[im 44/70]
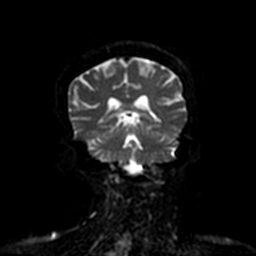
[im 52/70]
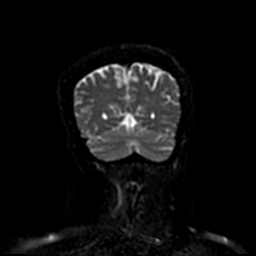
[im 61/70]
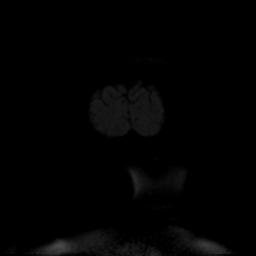
[im 70/70]
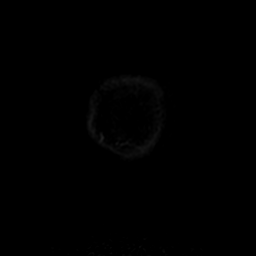

[Series 6: T2 · axial · 5.0mm · 0.86mm/px · z∈[-30,+103]mm · 2 of 20 slices shown (1 of 2)]
[im 1/20]
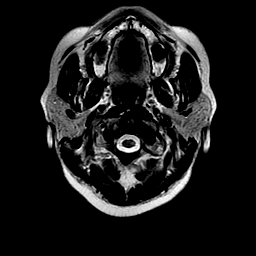
[im 20/20]
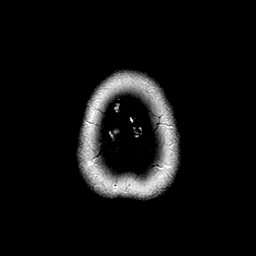

[Series 7: FLAIR · axial · 5.0mm · 0.43mm/px · z∈[-30,+103]mm · 2 of 20 slices shown]
[im 1/20]
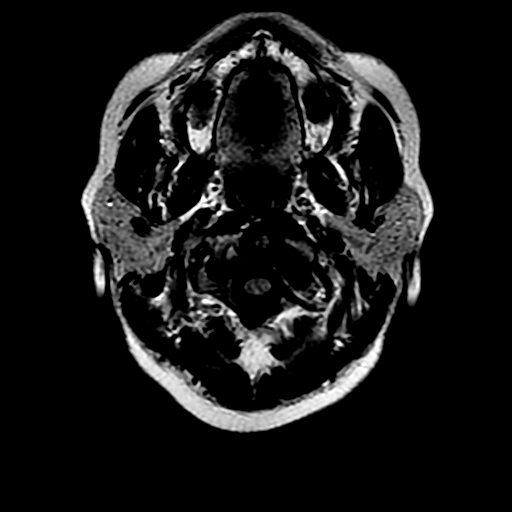
[im 20/20]
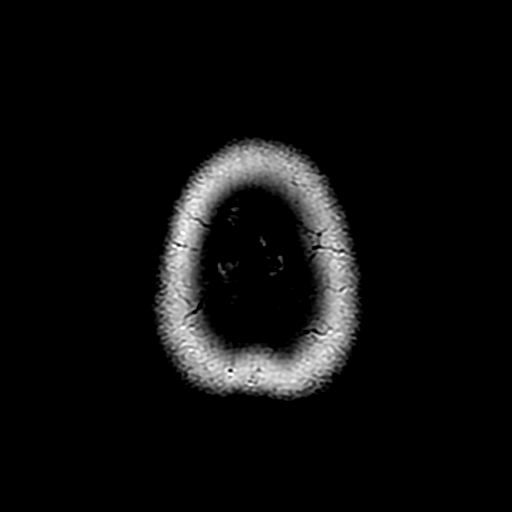

[Series 8: ax mpgr · axial · 5.0mm · 0.43mm/px · z∈[-30,+103]mm · 2 of 20 slices shown]
[im 1/20]
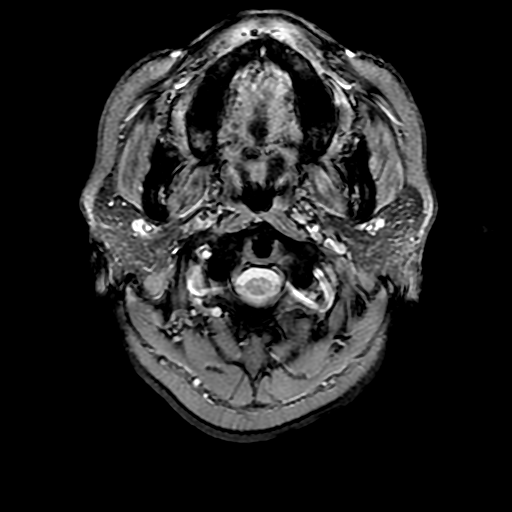
[im 20/20]
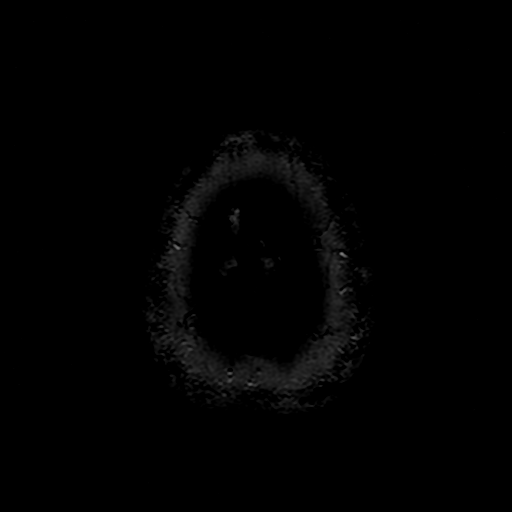

[Series 9: ax fspgr irp · axial · 3.0mm · 0.47mm/px · z∈[-29,+1]mm · 2 of 44 slices shown]
[im 1/44]
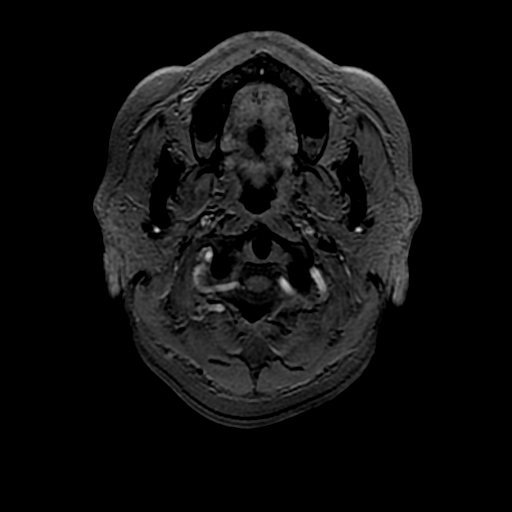
[im 11/44]
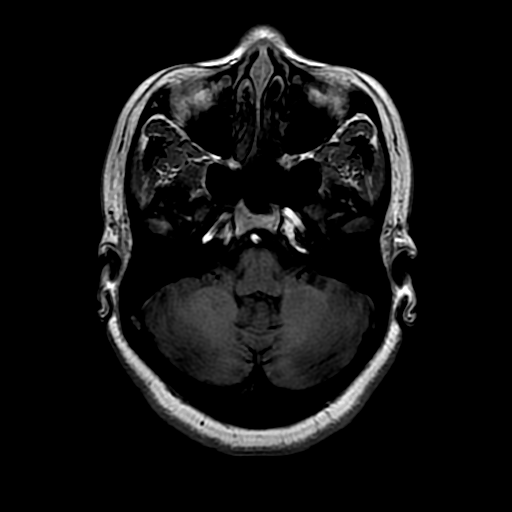

[Series 10: T2 · coronal · 5.0mm · 0.90mm/px · 3 of 27 slices shown (2 of 2)]
[im 1/27]
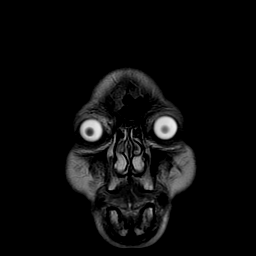
[im 14/27]
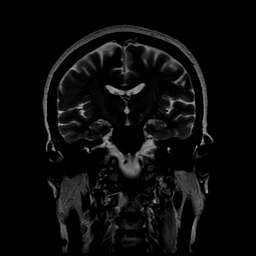
[im 27/27]
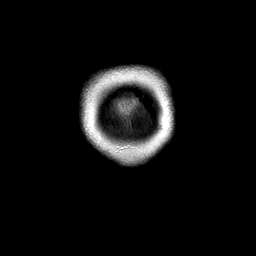

[Series 400: DWI · axial · 3.0mm · 1.09mm/px · z∈[-30,+102]mm · 5 of 45 slices shown (3 of 4)]
[im 1/45]
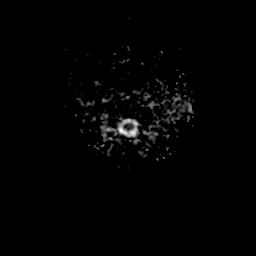
[im 12/45]
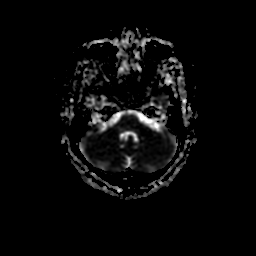
[im 23/45]
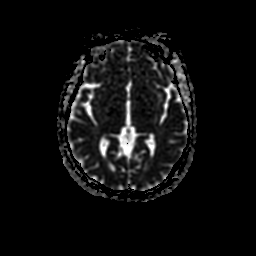
[im 34/45]
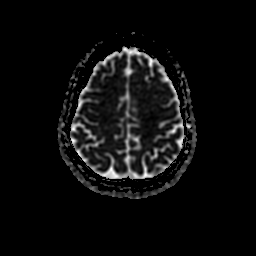
[im 45/45]
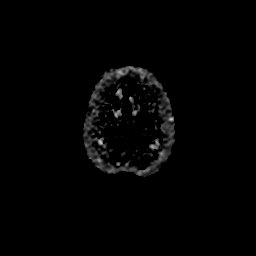

[Series 500: DWI · coronal · 5.0mm · 1.09mm/px · 4 of 35 slices shown (4 of 4)]
[im 1/35]
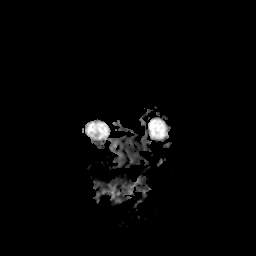
[im 12/35]
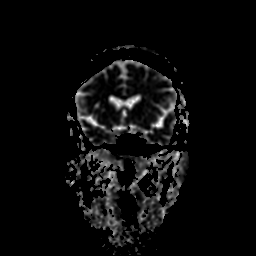
[im 23/35]
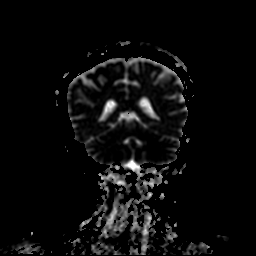
[im 35/35]
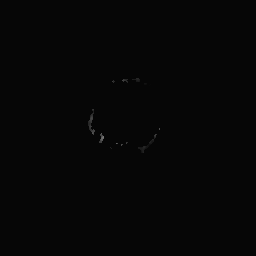

[42 of 48 positions shown; findings below may reference images not displayed]

FINDINGS: Brain: There is no evidence of acute infarct, intracranial
hemorrhage, mass, midline shift, or extra-axial fluid collection.
The ventricles and sulci are normal. The brain is normal in signal.

Vascular: Major intracranial vascular flow voids are preserved.

Skull and upper cervical spine: No suspicious marrow lesion.
Moderate upper cervical disc degeneration.

Sinuses/Orbits: Unremarkable orbits. Paranasal sinuses and mastoid
air cells are clear.

Other: None.
IMPRESSION: Negative brain MRI.

## 2021-12-02 ENCOUNTER — Encounter: Payer: Self-pay | Admitting: *Deleted

## 2021-12-03 ENCOUNTER — Encounter: Payer: Self-pay | Admitting: Neurology

## 2021-12-03 ENCOUNTER — Ambulatory Visit: Payer: BC Managed Care – PPO | Admitting: Neurology

## 2021-12-03 VITALS — BP 141/87 | HR 66 | Ht 63.5 in | Wt 175.5 lb

## 2021-12-03 DIAGNOSIS — M21372 Foot drop, left foot: Secondary | ICD-10-CM

## 2021-12-03 DIAGNOSIS — R269 Unspecified abnormalities of gait and mobility: Secondary | ICD-10-CM | POA: Diagnosis not present

## 2021-12-03 NOTE — Patient Instructions (Signed)
Referral to physical therapy Continue to follow with your PCP Return if worse or any other concerns.

## 2021-12-03 NOTE — Progress Notes (Signed)
[facility]  PATIENT: Alexandria Mcclure DOB: January 23, 1951  REQUESTING CLINICIAN: Heywood Bene, * HISTORY FROM: Patient  REASON FOR VISIT: Abnormal gait , dragging left leg   HISTORICAL  CHIEF COMPLAINT:  Chief Complaint  Patient presents with   Left foot drop, balance issue    Rm 13, New Pt "I have stumbled but not fallen"     HISTORY OF PRESENT ILLNESS:  This is a 71 year old woman past medical history of hypertension and hyperlipidemia who is presenting with complaint of dragging her left leg.  Patient reports that she noticed it for the past 3 weeks, denies any falls, reported her son has also noticed it.  She denies any incident that happened 3 weeks ago.  Denies any trauma.  She states in the morning, upon waking up, she is feeling off balance and she has to wait a little before taking her first steps.     OTHER MEDICAL CONDITIONS: Hypertension, Hyperlipidemia    REVIEW OF SYSTEMS: Full 14 system review of systems performed and negative with exception of: as noted in the HPI  ALLERGIES: No Known Allergies  HOME MEDICATIONS: Outpatient Medications Prior to Visit  Medication Sig Dispense Refill   amLODipine-benazepril (LOTREL) 10-40 MG capsule Take 1 capsule by mouth daily after breakfast.   0   aspirin EC 81 MG tablet Take 81 mg by mouth daily after breakfast.     HYDROcodone-acetaminophen (NORCO/VICODIN) 5-325 MG tablet Take 1-2 tablets by mouth every 6 (six) hours as needed for moderate pain or severe pain. 15 tablet 0   rosuvastatin (CRESTOR) 5 MG tablet Take 5 mg by mouth every other day.     triamterene-hydrochlorothiazide (MAXZIDE-25) 37.5-25 MG tablet Take 1 tablet by mouth daily after breakfast.   0   No facility-administered medications prior to visit.    PAST MEDICAL HISTORY: Past Medical History:  Diagnosis Date   Balance problem    CKD (chronic kidney disease), stage III (HCC)    Foot drop    GERD (gastroesophageal reflux  disease)    occasional - OTC as needed   History of seizure 1970   x 1 - after delivery of twins    Hypercholesterolemia    Hypertension    states under control with meds., has been on med. x 20 yr.   Immature cataract    Papilloma of right breast 09/2017   Seasonal allergies    Wears dentures    upper   Wears partial dentures    lower    PAST SURGICAL HISTORY: Past Surgical History:  Procedure Laterality Date   ABDOMINAL HYSTERECTOMY     partial   BREAST LUMPECTOMY WITH RADIOACTIVE SEED LOCALIZATION Right 10/19/2017   Procedure: RIGHT BREAST LUMPECTOMY WITH RADIOACTIVE SEED LOCALIZATION ERAS PATHWAY;  Surgeon: Jovita Kussmaul, MD;  Location: [facility];  Service: General;  Laterality: Right;    FAMILY HISTORY: Family History  Problem Relation Age of Onset   Hypertension Father    Heart disease Father    Diabetes Brother    Stroke Maternal Grandmother    Diabetes Paternal Grandmother     SOCIAL HISTORY: Social History   Socioeconomic History   Marital status: Single    Spouse name: Not on file   Number of children: 3   Years of education: Not on file   Highest education level: Not on file  Occupational History    Comment: custodian  Tobacco Use   Smoking status: Never   Smokeless tobacco:  Never  Vaping Use   Vaping Use: Never used  Substance and Sexual Activity   Alcohol use: Yes    Comment: occasionally   Drug use: Never   Sexual activity: Not on file  Other Topics Concern   Not on file  Social History Narrative   Lives alone   Social Determinants of Health   Financial Resource Strain: Not on file  Food Insecurity: Not on file  Transportation Needs: Not on file  Physical Activity: Not on file  Stress: Not on file  Social Connections: Not on file  Intimate Partner Violence: Not on file    PHYSICAL EXAM  GENERAL EXAM/CONSTITUTIONAL: Vitals:  Vitals:   12/03/21 0816  BP: (!) 141/87  Pulse: 66  Weight: 175 lb 8 oz (79.6 kg)   Height: 5' 3.5" (1.613 m)   Body mass index is 30.6 kg/m. Wt Readings from Last 3 Encounters:  12/03/21 175 lb 8 oz (79.6 kg)  10/19/17 180 lb (81.6 kg)  08/06/17 175 lb (79.4 kg)   Patient is in no distress; well developed, nourished and groomed; neck is supple  EYES: Pupils round and reactive to light, Visual fields full to confrontation, Extraocular movements intacts,   MUSCULOSKELETAL: Gait, strength, tone, movements noted in Neurologic exam below  NEUROLOGIC: MENTAL STATUS:      No data to display         awake, alert, oriented to person, place and time recent and remote memory intact normal attention and concentration language fluent, comprehension intact, naming intact fund of knowledge appropriate  CRANIAL NERVE: 2nd, 3rd, 4th, 6th - pupils equal and reactive to light, visual fields full to confrontation, extraocular muscles intact, no nystagmus 5th - facial sensation symmetric 7th - facial strength symmetric 8th - hearing intact 9th - palate elevates symmetrically, uvula midline 11th - shoulder shrug symmetric 12th - tongue protrusion midline  MOTOR:  normal bulk and tone, full strength in the BUE, RLE. LLE Hip flexion 5/5, knee extension 5/5, knee flexion 4+/5, plantar flexion 4/5, dorsiflexion 5/5, foot inversion 4/5 and eversion 5/5  SENSORY:  normal and symmetric to light touch,  COORDINATION:  finger-nose-finger, fine finger movements normal  REFLEXES:  deep tendon reflexes present and symmetric  GAIT/STATION:  Mild circumduction gait    DIAGNOSTIC DATA (LABS, IMAGING, TESTING) - I reviewed patient records, labs, notes, testing and imaging myself where available.  Lab Results  Component Value Date   WBC 9.4 08/06/2017   HGB 12.6 08/06/2017   HCT 39.0 08/06/2017   MCV 90.3 08/06/2017   PLT 301 08/06/2017      Component Value Date/Time   NA 140 10/17/2017 1500   K 3.8 10/17/2017 1500   CL 103 10/17/2017 1500   CO2 25 10/17/2017  1500   GLUCOSE 90 10/17/2017 1500   BUN 22 (H) 10/17/2017 1500   CREATININE 1.21 (H) 10/17/2017 1500   CALCIUM 9.9 10/17/2017 1500   PROT 7.1 08/06/2017 0137   ALBUMIN 3.8 08/06/2017 0137   AST 18 08/06/2017 0137   ALT 16 08/06/2017 0137   ALKPHOS 66 08/06/2017 0137   BILITOT 0.3 08/06/2017 0137   GFRNONAA 45 (L) 10/17/2017 1500   GFRAA 52 (L) 10/17/2017 1500   No results found for: "CHOL", "HDL", "LDLCALC", "LDLDIRECT", "TRIG", "CHOLHDL" No results found for: "HGBA1C" No results found for: "VITAMINB12" No results found for: "TSH"  MRI Brain 2019: Normal     ASSESSMENT AND PLAN  71 y.o. year old female with hypertension and hyperlipidemia who is  presenting with abnormal gait and dragging her left foot.  Patient reports this has been going on for the past 3 weeks and intermittent.  On exam, she does have very mild weakness on knee flexion, foot dorsiflexion and inversion.  Gait is mildly circumducted.  Patient has very mild left foot drop, otherwise the rest of her exam is normal.  I will refer her to physical therapy for evaluation, advised her to contact me if symptoms are getting worse or any other concerns.  Continue to follow-up with your PCP   1. Left foot drop   2. Abnormal gait      Patient Instructions  Referral to physical therapy Continue to follow with your PCP Return if worse or any other concerns.  Orders Placed This Encounter  Procedures   Ambulatory referral to Physical Therapy    No orders of the defined types were placed in this encounter.   Return if symptoms worsen or fail to improve.    Alric Ran, MD 12/03/2021, 9:00 AM  [facility] [address], [place], [place] [address] [phone]

## 2021-12-08 ENCOUNTER — Telehealth: Payer: Self-pay | Admitting: Neurology

## 2021-12-08 NOTE — Telephone Encounter (Signed)
Referral for Physical Therapy sent to [facility] [phone].

## 2023-01-19 ENCOUNTER — Ambulatory Visit
Admission: RE | Admit: 2023-01-19 | Discharge: 2023-01-19 | Disposition: A | Discharge status: Home / Self Care | Payer: Medicare PPO | Source: Ambulatory Visit | Attending: Family Medicine | Admitting: Family Medicine

## 2023-01-19 ENCOUNTER — Other Ambulatory Visit: Payer: Self-pay | Admitting: Family Medicine

## 2023-01-19 DIAGNOSIS — M25552 Pain in left hip: Secondary | ICD-10-CM

## 2023-02-08 ENCOUNTER — Other Ambulatory Visit: Payer: Self-pay | Admitting: Family Medicine

## 2023-02-08 ENCOUNTER — Encounter: Payer: Self-pay | Admitting: Family Medicine

## 2023-02-08 DIAGNOSIS — M5459 Other low back pain: Secondary | ICD-10-CM

## 2023-02-21 ENCOUNTER — Ambulatory Visit
Admission: RE | Admit: 2023-02-21 | Discharge: 2023-02-21 | Disposition: A | Discharge status: Home / Self Care | Payer: Medicare PPO | Source: Ambulatory Visit | Attending: Family Medicine

## 2023-02-21 DIAGNOSIS — M5459 Other low back pain: Secondary | ICD-10-CM

## 2024-01-08 ENCOUNTER — Other Ambulatory Visit: Payer: Self-pay | Admitting: Family Medicine

## 2024-01-08 ENCOUNTER — Ambulatory Visit
Admission: RE | Admit: 2024-01-08 | Discharge: 2024-01-08 | Disposition: A | Discharge status: Home / Self Care | Source: Ambulatory Visit | Attending: Family Medicine | Admitting: Family Medicine

## 2024-01-08 DIAGNOSIS — G8929 Other chronic pain: Secondary | ICD-10-CM
